# Patient Record
Sex: Male | Born: 2000 | Race: Black or African American | Hispanic: No | Marital: Single | Smoking: Never smoker
Health system: Southern US, Community
[De-identification: ages and names within clinical notes are randomized; demographics above are authoritative.]

## PROBLEM LIST (undated history)

## (undated) DIAGNOSIS — Z973 Presence of spectacles and contact lenses: Secondary | ICD-10-CM

## (undated) DIAGNOSIS — T7840XA Allergy, unspecified, initial encounter: Secondary | ICD-10-CM

## (undated) DIAGNOSIS — F909 Attention-deficit hyperactivity disorder, unspecified type: Secondary | ICD-10-CM

## (undated) HISTORY — PX: TYMPANOSTOMY TUBE PLACEMENT: SHX32

## (undated) HISTORY — DX: Attention-deficit hyperactivity disorder, unspecified type: F90.9

## (undated) HISTORY — DX: Presence of spectacles and contact lenses: Z97.3

## (undated) HISTORY — DX: Allergy, unspecified, initial encounter: T78.40XA

---
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
## 2022-01-01 ENCOUNTER — Encounter: Payer: Self-pay | Admitting: Internal Medicine

## 2022-02-04 ENCOUNTER — Encounter: Payer: Self-pay | Admitting: Internal Medicine

## 2023-10-02 ENCOUNTER — Emergency Department (HOSPITAL_BASED_OUTPATIENT_CLINIC_OR_DEPARTMENT_OTHER)
Admission: EM | Admit: 2023-10-02 | Discharge: 2023-10-02 | Disposition: A | Discharge status: Home / Self Care | Payer: Self-pay | Attending: Emergency Medicine | Admitting: Emergency Medicine

## 2023-10-02 ENCOUNTER — Other Ambulatory Visit: Payer: Self-pay

## 2023-10-02 ENCOUNTER — Encounter (HOSPITAL_BASED_OUTPATIENT_CLINIC_OR_DEPARTMENT_OTHER): Payer: Self-pay | Admitting: Emergency Medicine

## 2023-10-02 DIAGNOSIS — M25531 Pain in right wrist: Secondary | ICD-10-CM | POA: Insufficient documentation

## 2023-10-02 DIAGNOSIS — X503XXA Overexertion from repetitive movements, initial encounter: Secondary | ICD-10-CM | POA: Insufficient documentation

## 2023-10-02 DIAGNOSIS — Y990 Civilian activity done for income or pay: Secondary | ICD-10-CM | POA: Insufficient documentation

## 2023-10-02 NOTE — Discharge Instructions (Signed)
 Continue use of the brace while at work to control symptoms. As we discussed, you can take 2 Aleve twice daily. Follow up with Dr. Jonna Netter or with your primary care provided as needed.

## 2023-10-02 NOTE — ED Triage Notes (Signed)
 Right wrist pain  Started a few weeks ago.  Works as [employer]

## 2023-10-02 NOTE — ED Notes (Signed)
Reviewed discharge instructions and home care with pt. Pt verbalized understanding and had no further questions. Pt exited ED without complications.

## 2023-10-02 NOTE — ED Provider Notes (Signed)
  [place] EMERGENCY DEPARTMENT AT [facility] Provider Note   CSN: [id number] Arrival date & time: 10/02/23  1910     History  Chief Complaint  Patient presents with   Wrist Pain    Kyle Duran is a 23 y.o. male.  Patient to ED with right wrist pain x 2 weeks. He reports intermittent swelling. No direct injury. 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 He was seen at Urgent Care and given a wrist brace which he uses sometimes.   The history is provided by the patient. No language interpreter was used.  Wrist Pain       Home Medications Prior to Admission medications   Medication Sig Start Date End Date Taking? Authorizing Provider  benzonatate  (TESSALON ) 200 MG capsule Take 1 capsule (200 mg total) by mouth 2 (two) times daily as needed for cough. 09/02/21   Chana Comas, PA-C  iron  polysaccharides (NIFEREX) 150 MG capsule Take 1 capsule (150 mg total) by mouth daily. 12/14/20   Tysinger, Christiane Cowing, PA-C      Allergies    Patient has no known allergies.    Review of Systems   Review of Systems  Physical Exam Updated Vital Signs BP 136/78 (BP Location: Right Arm)   Pulse 69   Temp 98.2 F (36.8 C) (Oral)   Resp 17   SpO2 99%  Physical Exam Vitals and nursing note reviewed.  Constitutional:      Appearance: He is well-developed.  Pulmonary:     Effort: Pulmonary effort is normal.  Musculoskeletal:        General: Normal range of motion.     Cervical back: Normal range of motion.     Comments: Right wrist has no swelling or discoloration. No focal tenderness. 5/5 grip strength of the right hand.   Skin:    General: Skin is warm and dry.  Neurological:     Mental Status: He is alert and oriented to person, place, and time.     Sensory: No sensory deficit.     ED Results / Procedures / Treatments   Labs (all labs ordered are listed, but only abnormal results are displayed) Labs Reviewed  - No data to display  EKG None  Radiology No results found.  Procedures Procedures    Medications Ordered in ED Medications - No data to display  ED Course/ Medical Decision Making/ A&P Clinical Course as of 10/02/23 2013  Fri Oct 02, 2023  2011 Intermittent right wrist pain and swelling. Encouraged use of the brace while at work. Encouraged NSAIDs regularly. Refer to hand to direct further evaluation vs PT.  [SU]    Clinical Course User Index [SU] Mandy Second, PA-C                                 Medical Decision Making          Final Clinical Impression(s) / ED Diagnoses Final diagnoses:  Right wrist pain    Rx / DC Orders ED Discharge Orders     None         Rama Burkitt 10/02/23 2013    Afton Horse T, DO 10/03/23 1454

## 2023-10-06 ENCOUNTER — Ambulatory Visit: Admitting: Medical

## 2023-10-06 ENCOUNTER — Encounter: Payer: Self-pay | Admitting: Medical

## 2023-10-06 VITALS — BP 118/86 | HR 84 | Ht 75.0 in | Wt 381.4 lb

## 2023-10-06 DIAGNOSIS — M79671 Pain in right foot: Secondary | ICD-10-CM | POA: Diagnosis not present

## 2023-10-06 DIAGNOSIS — M778 Other enthesopathies, not elsewhere classified: Secondary | ICD-10-CM

## 2023-10-06 DIAGNOSIS — M25531 Pain in right wrist: Secondary | ICD-10-CM | POA: Diagnosis not present

## 2023-10-06 DIAGNOSIS — M79672 Pain in left foot: Secondary | ICD-10-CM

## 2023-10-06 DIAGNOSIS — L84 Corns and callosities: Secondary | ICD-10-CM

## 2023-10-06 MED ORDER — IBUPROFEN 600 MG PO TABS: 600.0000 mg | ORAL_TABLET | Freq: Three times a day (TID) | ORAL | 0 refills | Status: AC | PRN

## 2023-10-06 NOTE — Patient Instructions (Addendum)
 Wrist tendonitis - right hand  Recommendations: RICE - rest, ice, compression, elevation Rest when possible over the next 2 weeks, or try and use the right hand less.  Consider arm sling periodically throughout the day over the next 1-2 weeks. Ice - Use ice or cold therapy.  For example, use ice water pack or bag of frozen peas, 20 minutes 2-3 times daily this week.  Use cloth between skin and ice. Compression - use a splint. Either continue the splint you are using at bedtime or when not at work, or consider a little bit larger wrist splint with bigger, longer metal reinforcement  Elevation - when doing cold therapy, elevated arm up on a pillow Begin Ibuprofen  prescription 2-3 times daily for the next 5 days . 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

## 2023-10-06 NOTE — Progress Notes (Signed)
 Subjective:  Kyle Duran is a 23 y.o. male who presents for Chief Complaint  Patient presents with   Wrist Pain    Still having issues with his right wrist. On Friday it was swollen. He has applied ice but didn't help the pain. He is a Conservation officer, nature. He states that when he moves his wrist to the right he gets a sharp pain at the wrist area. 1-10 pain level his pain is a 3       Here for wrist pain.  Has had pain for 3 weeks, right wrist.  Is right handed.   No specific injury no trauma or fall.  Has had swelling.  No numbness, no tingling.   Works at [employer] as Conservation officer, nature.  Using splint, OTC analgesic.   Has used some cold therapy.   Also has rough brown area on heels and heel pain.   Attributes to standing long hours in one place. No feel numbness or tingling.  No other foot pain.  No foot swelling.  No other aggravating or relieving factors.    No other c/o.  Past Medical History:  Diagnosis Date   ADHD (attention deficit hyperactivity disorder)    Allergy    Wears glasses    Current Outpatient Medications on File Prior to Visit  Medication Sig Dispense Refill   iron  polysaccharides (NIFEREX) 150 MG capsule Take 1 capsule (150 mg total) by mouth daily. 90 capsule 0   benzonatate  (TESSALON ) 200 MG capsule Take 1 capsule (200 mg total) by mouth 2 (two) times daily as needed for cough. (Patient not taking: Reported on 10/06/2023) 20 capsule 0   No current facility-administered medications on file prior to visit.     The following portions of the patient's history were reviewed and updated as appropriate: allergies, current medications, past family history, past medical history, past social history, past surgical history and problem list.  ROS Otherwise as in subjective above    Objective: BP 118/86   Pulse 84   Ht 6\' 3"  (1.905 m)   Wt (!) 381 lb 6.4 oz (173 kg)   SpO2 96%   BMI 47.67 kg/m   General appearance: alert, no distress, well developed, well nourished MSK: Right  wrist without obvious tenderness, otherwise arm hand and finger nontender, mild pain with right wrist extension, otherwise no pain with range of motion which is full, no obvious swelling.  Bilateral heels with a brownish rough callused area on the back of the heel and mild tenderness of the volar posterior heel.  Achilles nontender.  Rest of feet nontender without obvious swelling or deformity wrist arm hand and feet range of motion normal Arms neurovascularly intact Legs neurovascularly intact No leg edema   Assessment: Encounter Diagnoses  Name Primary?   Right wrist pain Yes   Wrist tendonitis    Heel pain, bilateral    Callus of foot      Plan: Recommendations: RICE - rest, ice, compression, elevation Rest when possible over the next 2 weeks, or try and use the right hand less.  Consider arm sling periodically throughout the day over the next 1-2 weeks. Ice - Use ice or cold therapy.  For example, use ice water pack or bag of frozen peas, 20 minutes 2-3 times daily this week.  Use cloth between skin and ice. Compression - use a splint. Either continue the splint you are using at bedtime or when not at work, or consider a little bit larger wrist splint with bigger,  longer metal reinforcement  Elevation - when doing cold therapy, elevated arm up on a pillow Begin Ibuprofen  prescription 2-3 times daily for the next 5 days . 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  Diagnoses  and all orders for this visit:  Right wrist pain  Wrist tendonitis  Heel pain, bilateral  Callus of foot  Other orders -     ibuprofen  (ADVIL ) 600 MG tablet; Take 1 tablet (600 mg total) by mouth every 8 (eight) hours as needed.    Follow up: As needed

## 2023-10-20 ENCOUNTER — Encounter: Payer: Self-pay | Admitting: Medical

## 2023-10-22 ENCOUNTER — Encounter: Payer: Self-pay | Admitting: Medical

## 2024-04-13 ENCOUNTER — Other Ambulatory Visit: Payer: Self-pay

## 2024-04-13 ENCOUNTER — Ambulatory Visit: Payer: Self-pay

## 2024-04-13 ENCOUNTER — Encounter (HOSPITAL_BASED_OUTPATIENT_CLINIC_OR_DEPARTMENT_OTHER): Payer: Self-pay

## 2024-04-13 ENCOUNTER — Emergency Department (HOSPITAL_BASED_OUTPATIENT_CLINIC_OR_DEPARTMENT_OTHER)
Admission: EM | Admit: 2024-04-13 | Discharge: 2024-04-13 | Disposition: A | Discharge status: Home / Self Care | Attending: Emergency Medicine | Admitting: Emergency Medicine

## 2024-04-13 DIAGNOSIS — L0201 Cutaneous abscess of face: Secondary | ICD-10-CM | POA: Insufficient documentation

## 2024-04-13 DIAGNOSIS — R220 Localized swelling, mass and lump, head: Secondary | ICD-10-CM | POA: Diagnosis present

## 2024-04-13 MED ORDER — LIDOCAINE-EPINEPHRINE (PF) 2 %-1:200000 IJ SOLN
2.0000 mL | INTRAMUSCULAR | Status: DC
  Administered 2024-04-13: 18:00:00 2 mL

## 2024-04-13 MED ORDER — DOXYCYCLINE HYCLATE 100 MG PO TABS
100.0000 mg | ORAL_TABLET | Freq: Once | ORAL | Status: AC
  Administered 2024-04-13: 17:00:00 100 mg via ORAL
  Filled 2024-04-13: qty 1

## 2024-04-13 MED ORDER — LIDOCAINE-EPINEPHRINE (PF) 2 %-1:200000 IJ SOLN
10.0000 mL | Freq: Once | INTRAMUSCULAR | Status: AC
  Administered 2024-04-13: 17:00:00 10 mL
  Filled 2024-04-13: qty 20

## 2024-04-13 NOTE — Telephone Encounter (Signed)
 First attempt made to reach patient at 12:18. LM to return call to [phone]. Copied from CRM #[id number]. Topic: Clinical - Red Word Triage >> Apr 13, 2024 11:53 AM Hadassah PARAS wrote: Red Word that prompted transfer to Nurse Triage: Mother Kyle Duran on the line. Pt has a possible spider bite; face is swollen, big hard knot left side of face. Transferring to NT

## 2024-04-13 NOTE — Telephone Encounter (Signed)
 FYI Only or Action Required?: FYI only for provider: appointment scheduled on 04/14/2024 at 11:15 AM.  Patient was last seen in primary care on 10/06/2023 by Bulah Alm RAMAN, PA-C.  Called Nurse Triage reporting Insect Bite.  Symptoms began yesterday.  Interventions attempted: Rest, hydration, or home remedies.  Symptoms are: unchanged.  Triage Disposition: See Physician Within 24 Hours  Patient/caregiver understands and will follow disposition?: Yes  Reason for Disposition  [1] Red or very tender (to touch) area AND [2] started over 24 hours after the bite  Answer Assessment - Initial Assessment Questions Patient was bitten on his face by an insect. Area is the size of a nickel. Painful to the touch. Scheduled to see a provider in office tomorrow at 11:15 AM.   1. TYPE of INSECT: What type of insect was it?      Unsure of what type of insect 2. ONSET: When did you get bitten?      Patient thinks he might have gotten bit on Monday 3. LOCATION: Where is the insect bite located?      Left side of face 4. REDNESS: Is the area red or pink? If Yes, ask: What size is the area of redness? (inches or cm). When did the redness start?     Redness to the check 5. PAIN: Is there any pain? If Yes, ask: How bad is the pain? (Scale 0-10; or none, mild, moderate, severe)     Pain with touching-3 out of 10 6. ITCHING: Does it itch? If Yes, ask: How bad is the itch?      no 7. SWELLING: How big is the swelling? (e.g., inches, cm, or compare to coins)     Size of a nickel 8. OTHER SYMPTOMS: Do you have any other symptoms?  (e.g., difficulty breathing, fever, hives)     no  Protocols used: Insect Bite-A-AH

## 2024-04-13 NOTE — Discharge Instructions (Signed)
 Please read and follow all provided instructions.  Your diagnoses today include:  1. Facial abscess     Tests performed today include: Vital signs. See below for your results today.   Medications prescribed:  Doxycycline  - antibiotic  You have been prescribed an antibiotic medicine: take the entire course of medicine even if you are feeling better. Stopping early can cause the antibiotic not to work.  Take any prescribed medications only as directed.   Home care instructions:  Follow any educational materials contained in this packet  Follow-up instructions: Return to the Emergency Department in 48 hours for a recheck if your symptoms are not significantly improved.  Return instructions:  Return to the Emergency Department if you have: Fever Worsening symptoms Worsening pain Worsening swelling Redness of the skin that moves away from the affected area, especially if it streaks away from the affected area  Any other emergent concerns  Your vital signs today were: BP (!) 159/94 (BP Location: Right Wrist)   Pulse 89   Temp 98 F (36.7 C)   Resp 19   SpO2 99%  If your blood pressure (BP) was elevated above 135/85 this visit, please have this repeated by your doctor within one month. --------------

## 2024-04-13 NOTE — ED Provider Notes (Signed)
 [place] EMERGENCY DEPARTMENT AT [facility] Provider Note   CSN: [id number] Arrival date & time: 04/13/24  1639     Patient presents with: Facial Swelling   Kyle Duran is a 23 y.o. male.   Patient presents to the emergency department today for evaluation of swelling of his left cheek starting 2 days ago.  He is uncertain if he may have been bitten by something.  Denies trauma.  The area has gradually become more swollen over the past couple of days.  He has been applying topical Benadryl without improvement.  No dental injury or dental pain.  No ear pain.  No fevers.       Prior to Admission medications  Medication Sig Start Date End Date Taking? Authorizing Provider  benzonatate  (TESSALON ) 200 MG capsule Take 1 capsule (200 mg total) by mouth 2 (two) times daily as needed for cough. Patient not taking: Reported on 10/06/2023 09/02/21   Williams, Lynne B, PA-C  ibuprofen  (ADVIL ) 600 MG tablet Take 1 tablet (600 mg total) by mouth every 8 (eight) hours as needed. 10/06/23   Tysinger, Alm RAMAN, PA-C  iron  polysaccharides (NIFEREX) 150 MG capsule Take 1 capsule (150 mg total) by mouth daily. 12/14/20   Tysinger, Alm RAMAN, PA-C    Allergies: Patient has no known allergies.    Review of Systems  Updated Vital Signs BP (!) 159/94 (BP Location: Right Wrist)   Pulse 89   Temp 98 F (36.7 C)   Resp 19   SpO2 99%   Physical Exam Vitals and nursing note reviewed.  Constitutional:      Appearance: He is well-developed.  HENT:     Head: Normocephalic and atraumatic.     Comments: Patient with a small pustule noted to the left cheek, overlying the zygoma, with approximately 1 cm of induration and surrounding erythema consistent with developing abscess.  Area is tender to palpation.    Left Ear: Tympanic membrane, ear canal and external ear normal.     Nose: No congestion or rhinorrhea.     Mouth/Throat:     Comments: No intraoral abscess noted Eyes:      Conjunctiva/sclera: Conjunctivae normal.  Pulmonary:     Effort: No respiratory distress.  Musculoskeletal:     Cervical back: Normal range of motion and neck supple.  Skin:    General: Skin is warm and dry.  Neurological:     Mental Status: He is alert.     (all labs ordered are listed, but only abnormal results are displayed) Labs Reviewed - No data to display  EKG: None  Radiology: No results found.   .Incision and Drainage  Date/Time: 04/13/2024 5:37 PM  Performed by: Desiderio Chew, PA-C Authorized by: Desiderio Chew, PA-C   Consent:    Consent obtained:  Verbal   Consent given by:  Patient Location:    Type:  Abscess   Size:  1cm   Location:  Head   Head location:  Face Pre-procedure details:    Skin preparation:  Povidone-iodine Anesthesia:    Anesthesia method:  Local infiltration   Local anesthetic:  2 mL lidocaine -EPINEPHrine  2 %-1:200000 Procedure type:    Complexity:  Simple Procedure details:    Needle aspiration: yes     Needle gauge: 21g.   Drainage:  Purulent   Drainage amount:  Scant Post-procedure details:    Procedure completion:  Tolerated well, no immediate complications Comments:     After anesthesia, needle aspiration performed.  I was  able to express approximately 1/2 cc of purulent material, somewhat thick, with manual pressure.  Question small inflamed sebaceous cyst.  Minimal bleeding controlled easily with pressure.  Band-Aid applied.    Medications Ordered in the ED  doxycycline  (VIBRA -TABS) tablet 100 mg (has no administration in time range)  lidocaine -EPINEPHrine  (XYLOCAINE  W/EPI) 2 %-1:200000 (PF) injection 10 mL (has no administration in time range)   ED Course  Patient seen and examined. History obtained directly from patient and family member.  Labs/EKG: None ordered  Imaging: None ordered  Medications/Fluids: Ordered: P.o. doxycycline , lidocaine  2% with epinephrine .   Most recent vital signs reviewed and are as  follows: BP (!) 159/94 (BP Location: Right Wrist)   Pulse 89   Temp 98 F (36.7 C)   Resp 19   SpO2 99%   Initial impression: Developing cutaneous abscess on the left cheek, currently small, unclear if there is a consolidation of fluid.  Discussed attempt at needle aspiration with patient.  After discussion with patient and family member bedside, agrees to proceed.  5:39 PM Reassessment performed. Patient appears stable.  Tolerated procedure well.  I performed needle aspiration after injection of a small amount of lidocaine  and was able to express some purulent material with manual pressure as well.  Most current vital signs reviewed and are as follows: BP (!) 159/94 (BP Location: Right Wrist)   Pulse 89   Temp 98 F (36.7 C)   Resp 19   SpO2 99%   Plan: Discharge to home.   Prescriptions written for: Doxycycline  given facial abscess.  The patient was urged to return to the Emergency Department urgently with worsening pain, swelling, expanding erythema especially if it streaks away from the affected area, fever, or if they have any other concerns.   The patient was urged to return to the Emergency Department or go to their PCP in 48 hours for wound recheck if the area is not significantly improved.  The patient verbalized understanding and stated agreement with this plan.                                   Medical Decision Making Risk Prescription drug management.   Patient with small superficial cutaneous facial abscess with overlying pustule.  Needle aspiration performed after local anesthesia.  Patient tolerated well.  I was able to express a small amount of purulent fluid consistent with size of abscess.  Mild overlying cellulitis.  Given the sensitive nature of the location of this skin infection, oral doxycycline  was prescribed.     Final diagnoses:  Facial abscess    ED Discharge Orders          Ordered    doxycycline  (VIBRAMYCIN ) 100 MG capsule  2 times daily         04/13/24 1736               Desiderio Chew, PA-C 04/13/24 1740    Charlyn Sora, MD 04/13/24 [phone]

## 2024-04-13 NOTE — ED Triage Notes (Signed)
 Patient reports he has had facial swelling starting with a bump on Monday and progressing more today. Unsure what caused it, thinks maybe it was a spider bite. Left sided facial swelling is noted, no airway compromise.

## 2024-04-14 ENCOUNTER — Ambulatory Visit: Admitting: Family Medicine

## 2024-10-24 ENCOUNTER — Encounter: Payer: Self-pay | Admitting: Medical
